# Patient Record
Sex: Female | Born: 1970 | Race: Black or African American | Hispanic: No | Smoking: Never smoker
Health system: Southern US, Community
[De-identification: ages and names within clinical notes are randomized; demographics above are authoritative.]

## PROBLEM LIST (undated history)

## (undated) DIAGNOSIS — E785 Hyperlipidemia, unspecified: Secondary | ICD-10-CM

## (undated) DIAGNOSIS — I10 Essential (primary) hypertension: Secondary | ICD-10-CM

## (undated) HISTORY — PX: ABDOMINAL HYSTERECTOMY: SHX81

---

## 2004-10-25 ENCOUNTER — Ambulatory Visit: Payer: Self-pay | Admitting: General Practice

## 2005-02-27 ENCOUNTER — Emergency Department: Payer: Self-pay | Admitting: Emergency Medicine

## 2005-05-30 ENCOUNTER — Emergency Department: Payer: Self-pay | Admitting: Emergency Medicine

## 2005-05-30 ENCOUNTER — Other Ambulatory Visit: Payer: Self-pay

## 2005-11-26 ENCOUNTER — Emergency Department: Payer: Self-pay | Admitting: Emergency Medicine

## 2006-04-30 ENCOUNTER — Emergency Department: Payer: Self-pay | Admitting: Emergency Medicine

## 2006-05-15 ENCOUNTER — Emergency Department: Payer: Self-pay | Admitting: Emergency Medicine

## 2006-11-20 ENCOUNTER — Emergency Department: Payer: Self-pay | Admitting: Emergency Medicine

## 2006-11-20 ENCOUNTER — Other Ambulatory Visit: Payer: Self-pay

## 2007-07-02 ENCOUNTER — Emergency Department: Payer: Self-pay | Admitting: Unknown Physician Specialty

## 2007-09-11 ENCOUNTER — Emergency Department: Payer: Self-pay | Admitting: Emergency Medicine

## 2008-06-11 ENCOUNTER — Emergency Department: Payer: Self-pay | Admitting: Emergency Medicine

## 2008-10-14 ENCOUNTER — Emergency Department: Payer: Self-pay | Admitting: Unknown Physician Specialty

## 2008-12-14 ENCOUNTER — Emergency Department: Payer: Self-pay | Admitting: Emergency Medicine

## 2009-02-06 ENCOUNTER — Ambulatory Visit: Payer: Self-pay

## 2009-02-12 ENCOUNTER — Ambulatory Visit: Payer: Self-pay

## 2009-08-14 ENCOUNTER — Ambulatory Visit: Payer: Self-pay

## 2009-08-20 ENCOUNTER — Inpatient Hospital Stay: Payer: Self-pay

## 2009-09-11 ENCOUNTER — Emergency Department: Payer: Self-pay | Admitting: Emergency Medicine

## 2010-10-04 ENCOUNTER — Emergency Department: Payer: Self-pay | Admitting: Emergency Medicine

## 2010-12-06 ENCOUNTER — Emergency Department: Payer: Self-pay | Admitting: Emergency Medicine

## 2011-05-11 ENCOUNTER — Emergency Department: Payer: Self-pay | Admitting: Internal Medicine

## 2011-08-10 ENCOUNTER — Emergency Department: Payer: Self-pay | Admitting: Emergency Medicine

## 2011-09-14 ENCOUNTER — Ambulatory Visit: Payer: Self-pay

## 2011-09-21 ENCOUNTER — Ambulatory Visit: Payer: Self-pay

## 2012-01-09 ENCOUNTER — Emergency Department: Payer: Self-pay | Admitting: Emergency Medicine

## 2012-01-09 LAB — COMPREHENSIVE METABOLIC PANEL
Albumin: 3.8 g/dL (ref 3.4–5.0)
Alkaline Phosphatase: 36 U/L — ABNORMAL LOW (ref 50–136)
BUN: 8 mg/dL (ref 7–18)
Bilirubin,Total: 0.3 mg/dL (ref 0.2–1.0)
Calcium, Total: 8.7 mg/dL (ref 8.5–10.1)
Chloride: 105 mmol/L (ref 98–107)
Co2: 25 mmol/L (ref 21–32)
Creatinine: 0.64 mg/dL (ref 0.60–1.30)
EGFR (Non-African Amer.): >60
Glucose: 99 mg/dL (ref 65–99)
SGOT(AST): 13 U/L — ABNORMAL LOW (ref 15–37)
SGPT (ALT): 15 U/L
Total Protein: 7.5 g/dL (ref 6.4–8.2)

## 2012-01-09 LAB — TROPONIN I
Troponin-I: 0.05 ng/mL
Troponin-I: 0.05 ng/mL

## 2012-01-09 LAB — CBC
HCT: 38.6 % (ref 35.0–47.0)
HGB: 13 g/dL (ref 12.0–16.0)
MCHC: 33.7 g/dL (ref 32.0–36.0)
MCV: 87 fL (ref 80–100)
RBC: 4.46 10*6/uL (ref 3.80–5.20)
WBC: 6.4 10*3/uL (ref 3.6–11.0)

## 2012-01-09 LAB — CK TOTAL AND CKMB (NOT AT ARMC)
CK, Total: 154 U/L (ref 21–215)
CK-MB: 0.9 ng/mL (ref 0.5–3.6)

## 2012-01-31 ENCOUNTER — Ambulatory Visit: Payer: Self-pay

## 2013-06-24 ENCOUNTER — Emergency Department: Payer: Self-pay | Admitting: Emergency Medicine

## 2013-07-24 ENCOUNTER — Ambulatory Visit: Payer: Self-pay

## 2013-09-08 ENCOUNTER — Emergency Department: Payer: Self-pay | Admitting: Emergency Medicine

## 2013-11-14 ENCOUNTER — Emergency Department: Payer: Self-pay | Admitting: Emergency Medicine

## 2013-11-14 LAB — CBC
HCT: 40.7 % (ref 35.0–47.0)
RBC: 4.83 10*6/uL (ref 3.80–5.20)
RDW: 13 % (ref 11.5–14.5)

## 2013-11-14 LAB — BASIC METABOLIC PANEL
BUN: 11 mg/dL (ref 7–18)
Chloride: 102 mmol/L (ref 98–107)
Osmolality: 269 (ref 275–301)
Potassium: 3.4 mmol/L — ABNORMAL LOW (ref 3.5–5.1)
Sodium: 135 mmol/L — ABNORMAL LOW (ref 136–145)

## 2013-12-17 ENCOUNTER — Emergency Department: Payer: Self-pay | Admitting: Emergency Medicine

## 2013-12-17 LAB — RAPID INFLUENZA A&B ANTIGENS

## 2013-12-27 ENCOUNTER — Emergency Department: Payer: Self-pay | Admitting: Emergency Medicine

## 2013-12-28 LAB — BASIC METABOLIC PANEL
Anion Gap: 6 — ABNORMAL LOW (ref 7–16)
BUN: 8 mg/dL (ref 7–18)
CALCIUM: 9.6 mg/dL (ref 8.5–10.1)
Chloride: 102 mmol/L (ref 98–107)
Co2: 26 mmol/L (ref 21–32)
Creatinine: 0.56 mg/dL — ABNORMAL LOW (ref 0.60–1.30)
GLUCOSE: 92 mg/dL (ref 65–99)
Osmolality: 266 (ref 275–301)
Potassium: 3.8 mmol/L (ref 3.5–5.1)
SODIUM: 134 mmol/L — AB (ref 136–145)

## 2013-12-28 LAB — CBC
HCT: 40.9 % (ref 35.0–47.0)
HGB: 13.3 g/dL (ref 12.0–16.0)
MCH: 27.8 pg (ref 26.0–34.0)
MCHC: 32.6 g/dL (ref 32.0–36.0)
MCV: 85 fL (ref 80–100)
PLATELETS: 334 10*3/uL (ref 150–440)
RBC: 4.8 10*6/uL (ref 3.80–5.20)
RDW: 13.3 % (ref 11.5–14.5)
WBC: 9.1 10*3/uL (ref 3.6–11.0)

## 2014-03-05 ENCOUNTER — Ambulatory Visit: Payer: Self-pay | Admitting: Family Medicine

## 2014-06-05 ENCOUNTER — Emergency Department: Payer: Self-pay | Admitting: Emergency Medicine

## 2014-06-05 LAB — COMPREHENSIVE METABOLIC PANEL
ALK PHOS: 44 U/L — AB
ALT: 22 U/L (ref 12–78)
AST: 27 U/L (ref 15–37)
Albumin: 4.1 g/dL (ref 3.4–5.0)
Anion Gap: 7 (ref 7–16)
BUN: 8 mg/dL (ref 7–18)
Bilirubin,Total: 0.5 mg/dL (ref 0.2–1.0)
CALCIUM: 9.4 mg/dL (ref 8.5–10.1)
CHLORIDE: 105 mmol/L (ref 98–107)
Co2: 24 mmol/L (ref 21–32)
Creatinine: 0.62 mg/dL (ref 0.60–1.30)
EGFR (African American): >60
EGFR (Non-African Amer.): >60
Glucose: 95 mg/dL (ref 65–99)
OSMOLALITY: 270 (ref 275–301)
POTASSIUM: 3.7 mmol/L (ref 3.5–5.1)
Sodium: 136 mmol/L (ref 136–145)
TOTAL PROTEIN: 8.1 g/dL (ref 6.4–8.2)

## 2014-06-05 LAB — CBC
HCT: 39.2 % (ref 35.0–47.0)
HGB: 13.4 g/dL (ref 12.0–16.0)
MCH: 28.9 pg (ref 26.0–34.0)
MCHC: 34.1 g/dL (ref 32.0–36.0)
MCV: 85 fL (ref 80–100)
Platelet: 297 10*3/uL (ref 150–440)
RBC: 4.63 10*6/uL (ref 3.80–5.20)
RDW: 13.3 % (ref 11.5–14.5)
WBC: 6.7 10*3/uL (ref 3.6–11.0)

## 2014-09-18 ENCOUNTER — Emergency Department: Payer: Self-pay | Admitting: Emergency Medicine

## 2014-09-18 LAB — CBC
HCT: 41.3 % (ref 35.0–47.0)
HGB: 13.2 g/dL (ref 12.0–16.0)
MCH: 27.7 pg (ref 26.0–34.0)
MCHC: 31.9 g/dL — AB (ref 32.0–36.0)
MCV: 87 fL (ref 80–100)
Platelet: 339 10*3/uL (ref 150–440)
RBC: 4.76 10*6/uL (ref 3.80–5.20)
RDW: 13.1 % (ref 11.5–14.5)
WBC: 7.9 10*3/uL (ref 3.6–11.0)

## 2014-09-18 LAB — BASIC METABOLIC PANEL
ANION GAP: 7 (ref 7–16)
BUN: 9 mg/dL (ref 7–18)
CHLORIDE: 103 mmol/L (ref 98–107)
Calcium, Total: 8.8 mg/dL (ref 8.5–10.1)
Co2: 29 mmol/L (ref 21–32)
Creatinine: 0.72 mg/dL (ref 0.60–1.30)
EGFR (Non-African Amer.): >60
GLUCOSE: 100 mg/dL — AB (ref 65–99)
OSMOLALITY: 276 (ref 275–301)
Potassium: 3.1 mmol/L — ABNORMAL LOW (ref 3.5–5.1)
Sodium: 139 mmol/L (ref 136–145)

## 2014-09-18 LAB — TROPONIN I
Troponin-I: <0.02 ng/mL
Troponin-I: <0.02 ng/mL

## 2015-09-30 ENCOUNTER — Ambulatory Visit: Payer: Self-pay

## 2015-10-05 ENCOUNTER — Encounter: Payer: Self-pay | Admitting: *Deleted

## 2015-10-05 ENCOUNTER — Ambulatory Visit
Admission: RE | Admit: 2015-10-05 | Discharge: 2015-10-05 | Disposition: A | Discharge status: Home / Self Care | Payer: Self-pay | Source: Ambulatory Visit | Attending: Oncology | Admitting: Oncology

## 2015-10-05 ENCOUNTER — Ambulatory Visit: Payer: Self-pay | Attending: Oncology | Admitting: *Deleted

## 2015-10-05 VITALS — BP 150/92 | HR 75 | Temp 98.0°F | Resp 18 | Ht 65.75 in | Wt 208.6 lb

## 2015-10-05 DIAGNOSIS — Z0000 Encounter for general adult medical examination without abnormal findings: Secondary | ICD-10-CM

## 2015-10-05 DIAGNOSIS — Z1231 Encounter for screening mammogram for malignant neoplasm of breast: Secondary | ICD-10-CM | POA: Insufficient documentation

## 2015-10-05 NOTE — Progress Notes (Signed)
Subjective:     Patient ID: Bethany Collins, female   DOB: 12/02/1971, 44 y.o.   MRN: [id number]  HPI   Review of Systems     Objective:   Physical Exam  Pulmonary/Chest: Right breast exhibits inverted nipple. Right breast exhibits no mass, no nipple discharge, no skin change and no tenderness. Left breast exhibits inverted nipple. Left breast exhibits no mass, no nipple discharge, no skin change and no tenderness. Breasts are symmetrical.  Bilateral nipples are inverted.  Patient states this is normal for her.       Assessment:        44 year [facility] Black [facility]  Clinical breast exam unremarkable.  Patient with bilateral inverted nipples that she states is normal for her.  Taught self breast awareness.   Blood pressure elevated at 150/92.  She is to take her meds as soon as possible and recheck her blood pressure at [facility] or [facility], and if remains higher than 140/90 she is to follow-up with her primary care provider.  Hand out on hypertention given to patient. Patient has been screened for eligibility.  She does not have any insurance, Medicare or Medicaid.  She also meets financial eligibility.  Hand-out given on the Affordable Care Act.  Plan:     Screening mammogram ordered.  Will follow up per protocol.

## 2015-10-05 NOTE — Patient Instructions (Signed)
Gave patient hand-out, Women Staying Healthy, Active and Well from [facility], with education on breast health, pap smears, heart and colon health. 

## 2015-10-06 NOTE — Progress Notes (Signed)
Letter mailed from [facility] to inform patient of her normal mammogram results.  Patient is to follow-up with annual screening in one year.  [facility] to [facility]y. 

## 2016-11-23 ENCOUNTER — Encounter: Payer: Self-pay | Admitting: Emergency Medicine

## 2016-11-23 ENCOUNTER — Emergency Department: Payer: Self-pay

## 2016-11-23 ENCOUNTER — Emergency Department
Admission: EM | Admit: 2016-11-23 | Discharge: 2016-11-23 | Disposition: A | Discharge status: Home / Self Care | Payer: Self-pay | Attending: Emergency Medicine | Admitting: Emergency Medicine

## 2016-11-23 DIAGNOSIS — R079 Chest pain, unspecified: Secondary | ICD-10-CM | POA: Insufficient documentation

## 2016-11-23 DIAGNOSIS — I10 Essential (primary) hypertension: Secondary | ICD-10-CM | POA: Insufficient documentation

## 2016-11-23 HISTORY — DX: Hyperlipidemia, unspecified: E78.5

## 2016-11-23 HISTORY — DX: Essential (primary) hypertension: I10

## 2016-11-23 LAB — CBC
HCT: 41.1 % (ref 35.0–47.0)
Hemoglobin: 13.6 g/dL (ref 12.0–16.0)
MCH: 28.3 pg (ref 26.0–34.0)
MCHC: 33.2 g/dL (ref 32.0–36.0)
MCV: 85.2 fL (ref 80.0–100.0)
PLATELETS: 418 10*3/uL (ref 150–440)
RBC: 4.82 MIL/uL (ref 3.80–5.20)
RDW: 13 % (ref 11.5–14.5)
WBC: 9.9 10*3/uL (ref 3.6–11.0)

## 2016-11-23 LAB — BASIC METABOLIC PANEL
Anion gap: 9 (ref 5–15)
BUN: 9 mg/dL (ref 6–20)
CHLORIDE: 101 mmol/L (ref 101–111)
CO2: 28 mmol/L (ref 22–32)
CREATININE: 0.67 mg/dL (ref 0.44–1.00)
Calcium: 9.6 mg/dL (ref 8.9–10.3)
Glucose, Bld: 91 mg/dL (ref 65–99)
POTASSIUM: 3.4 mmol/L — AB (ref 3.5–5.1)
SODIUM: 138 mmol/L (ref 135–145)

## 2016-11-23 LAB — TROPONIN I: Troponin I: <0.03 ng/mL (ref <0.03)

## 2016-11-23 NOTE — ED Triage Notes (Signed)
Pt comes into the ED via POV c/o chest pain on the left side of chest accompanied with tingling into the left arm.  Patient states it was a tightness that comes and go.  Patient in NAD at this time with even and unlabored respirations.  Patient denies and dizziness, N/V, lightheadedness.

## 2016-11-23 NOTE — ED Provider Notes (Signed)
[facility] Emergency Department Provider Note  Time seen: 2:24 PM  I have reviewed the triage vital signs and the nursing notes.   HISTORY  Chief Complaint Chest Pain    HPI Bethany Collins is a 45 y.o. female with a past medical history of hypertension, hyperlipidemia presents the emergency department for chest pain. According to the patient since early this morning she has been expressing intermittent chest pressure. States she was at work today when she felt a chest pressure, states she began breathing deeply which made the chest pressure go away but then began feeling tingling in her hands especially her left hand. States her symptoms have largely resolved at this time. Denies any direct history of cardiac disease but does state her mother's sister had a heart attack. Denies any nausea, shortness of breath or diaphoresis at any point. Describes the chest pressure is intermittent, mild and currently gone.  Past Medical History:  Diagnosis Date  . Hyperlipidemia   . Hypertension     There are no active problems to display for this patient.   Past Surgical History:  Procedure Laterality Date  . ABDOMINAL HYSTERECTOMY      Prior to Admission medications   Not on File    Allergies  Allergen Reactions  . Lisinopril Swelling    Family History  Problem Relation Age of Onset  . Breast cancer Cousin 45  . Breast cancer Cousin 4765    Social History Social History  Substance Use Topics  . Smoking status: Never Smoker  . Smokeless tobacco: Never Used  . Alcohol use No    Review of Systems Constitutional: Negative for fever. Cardiovascular: Intermittent chest pressure Respiratory: Negative for shortness of breath. Gastrointestinal: Negative for abdominal pain Musculoskeletal: Negative for back pain. Neurological: Negative for headache 10-point ROS otherwise negative.  ____________________________________________   PHYSICAL EXAM:  VITAL  SIGNS: ED Triage Vitals  Enc Vitals Group     BP 11/23/16 1127 (!) 145/76     Pulse Rate 11/23/16 1127 65     Resp 11/23/16 1127 17     Temp 11/23/16 1127 98.3 F (36.8 C)     Temp Source 11/23/16 1127 Oral     SpO2 11/23/16 1127 98 %     Weight 11/23/16 1127 200 lb (90.7 kg)     Height 11/23/16 1127 5\' 6"  (1.676 m)     Head Circumference --      Peak Flow --      Pain Score 11/23/16 1128 4     Pain Loc --      Pain Edu? --      Excl. in GC? --     Constitutional: Alert and oriented. Well appearing and in no distress. Eyes: Normal exam ENT   Head: Normocephalic and atraumatic.   Mouth/Throat: Mucous membranes are moist. Cardiovascular: Normal rate, regular rhythm. No murmur Respiratory: Normal respiratory effort without tachypnea nor retractions. Breath sounds are clear. Mild chest tenderness to palpation. Gastrointestinal: Soft and nontender. No distention.   Musculoskeletal: Nontender with normal range of motion in all extremities.  Neurologic:  Normal speech and language. No gross focal neurologic deficits  Skin:  Skin is warm, dry and intact.  Psychiatric: Mood and affect are normal.   ____________________________________________    EKG  EKG reviewed and interpreted by myself shows normal sinus rhythm at 63 bpm, narrow QRS, normal axis, normal intervals, no ST changes.  ____________________________________________    RADIOLOGY  Chest x-ray negative  ____________________________________________  INITIAL IMPRESSION / ASSESSMENT AND PLAN / ED COURSE  Pertinent labs & imaging results that were available during my care of the patient were reviewed by me and considered in my medical decision making (see chart for details).  The patient presents the emergency department for intermittent chest pressure beginning early this morning. Currently the patient is chest pain-free, does have mild chest tenderness to palpation. Patient's labs are within normal limits  including negative troponin, EKG is reassuring. Chest x-ray is negative. Given the patient's normal workup with resolution of chest discomfort I believe the patient is safe for discharge home with cardiology follow-up for a stress test in the near future. Patient is agreeable to plan and will call tomorrow to arrange this.  ____________________________________________   FINAL CLINICAL IMPRESSION(S) / ED DIAGNOSES  Chest pain    Minna AntisKevin Kunal Levario, MD 11/23/16 1426

## 2016-11-23 NOTE — Discharge Instructions (Signed)
You have been seen in the emergency department today for chest pain. Your workup has shown normal results. As we discussed please follow-up with your primary care physician in the next 1-2 days for recheck. Return to the emergency department for any further chest pain, trouble breathing, or any other symptom personally concerning to yourself. ° °Please call the number provided for cardiology to arrange a stress test as soon as possible. °

## 2016-12-14 ENCOUNTER — Ambulatory Visit
Admission: RE | Admit: 2016-12-14 | Discharge: 2016-12-14 | Disposition: A | Discharge status: Home / Self Care | Payer: Self-pay | Source: Ambulatory Visit | Attending: Oncology | Admitting: Oncology

## 2016-12-14 ENCOUNTER — Ambulatory Visit: Payer: Self-pay | Attending: Oncology

## 2016-12-14 VITALS — BP 134/81 | HR 80 | Temp 97.8°F | Ht 66.14 in | Wt 219.6 lb

## 2016-12-14 DIAGNOSIS — Z0000 Encounter for general adult medical examination without abnormal findings: Secondary | ICD-10-CM

## 2016-12-14 NOTE — Progress Notes (Signed)
Subjective:     Patient ID: Denita LungDeanna S Beaulac, female   DOB: 02/16/1971, 46 y.o.   MRN: [id number]  HPI   Review of Systems     Objective:   Physical Exam  Pulmonary/Chest: Right breast exhibits no inverted nipple, no mass, no nipple discharge, no skin change and no tenderness. Left breast exhibits no inverted nipple, no mass, no nipple discharge, no skin change and no tenderness. Breasts are symmetrical.       Assessment:     46 [facility].  Patient screened, and meets [facility] eligibility.  Patient does not have insurance, Medicare or Medicaid.  Handout given on Affordable Care Act. Instructed patient on breast self-exam using teach back method.  CBE unremarkable.  No mass or lump palpated.  Large pendulous breasts.    Plan:     Sent for bilateral screening mammogram.

## 2016-12-20 NOTE — Progress Notes (Signed)
Letter mailed from [facility] to notify of normal mammogram results.  Patient to return in one year for annual screening.  Copy to [facility]. 

## 2017-01-11 ENCOUNTER — Encounter: Payer: Self-pay | Admitting: Emergency Medicine

## 2017-01-11 ENCOUNTER — Emergency Department: Payer: Self-pay

## 2017-01-11 DIAGNOSIS — M79605 Pain in left leg: Secondary | ICD-10-CM | POA: Insufficient documentation

## 2017-01-11 DIAGNOSIS — I10 Essential (primary) hypertension: Secondary | ICD-10-CM | POA: Insufficient documentation

## 2017-01-11 NOTE — ED Triage Notes (Signed)
Pt ambulatory to triage in NAD, reports left lower leg pain x 1 day, states feels aching and tight, pt recently flew on Thursday.

## 2017-01-12 ENCOUNTER — Emergency Department
Admission: EM | Admit: 2017-01-12 | Discharge: 2017-01-12 | Disposition: A | Discharge status: Home / Self Care | Payer: Self-pay | Attending: Emergency Medicine | Admitting: Emergency Medicine

## 2017-01-12 DIAGNOSIS — M79605 Pain in left leg: Secondary | ICD-10-CM

## 2017-01-12 NOTE — Discharge Instructions (Signed)
Please seek medical attention for any high fevers, chest pain, shortness of breath, change in behavior, persistent vomiting, bloody stool or any other new or concerning symptoms.  

## 2017-01-12 NOTE — ED Provider Notes (Signed)
[facility] Emergency Department Provider Note   ____________________________________________   I have reviewed the triage vital signs and the nursing notes.   HISTORY  Chief Complaint Leg Pain   History limited by: Not Limited   HPI Bethany Collins is a 46 y.o. female who presents to the emergency department today because of concerns for left calf pain. The patient states pain started today. 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 He continued to get worse throughout the day and she noticed that it was a bit stiff after she had sat down for bingo. Patient describes the pain as being located behind her left knee. She states it is tightness. She denies any numbness or pain going down the rest of her leg. No trauma to her leg. She recently did return from travel.   Past Medical History:  Diagnosis Date  . Hyperlipidemia   . Hypertension     There are no active problems to display for this patient.   Past Surgical History:  Procedure Laterality Date  . ABDOMINAL HYSTERECTOMY      Prior to Admission medications   Not on File    Allergies Lisinopril  Family History  Problem Relation Age of Onset  . Breast cancer Cousin 45  . Breast cancer Cousin 74    Social History Social History  Substance Use Topics  . Smoking status: Never Smoker  . Smokeless tobacco: Never Used  . Alcohol use No    Review of Systems  Constitutional: Negative for fever. Cardiovascular: Negative for chest pain. Respiratory: Negative for shortness of breath. Gastrointestinal: Negative for abdominal pain, vomiting and diarrhea. Genitourinary: Negative for dysuria. Musculoskeletal: Positive for left leg pain. Neurological: Negative for headaches, focal weakness or numbness.  10-point ROS otherwise negative.  ____________________________________________   PHYSICAL EXAM:  VITAL SIGNS: ED Triage Vitals [01/11/17 2244]  Enc Vitals Group     BP  135/68     Pulse Rate 74     Resp 16     Temp 97.9 F (36.6 C)     Temp Source Oral     SpO2 98 %     Weight 170 lb (77.1 kg)     Height 5\' 5"  (1.651 m)     Head Circumference      Peak Flow      Pain Score 8    Constitutional: Alert and oriented. Well appearing and in no distress. Eyes: Conjunctivae are normal. Normal extraocular movements. ENT   Head: Normocephalic and atraumatic.   Nose: No congestion/rhinnorhea.   Mouth/Throat: Mucous membranes are moist.   Neck: No stridor. Hematological/Lymphatic/Immunilogical: No cervical lymphadenopathy. Cardiovascular: Normal rate, regular rhythm.  No murmurs, rubs, or gallops.  Respiratory: Normal respiratory effort without tachypnea nor retractions. Breath sounds are clear and equal bilaterally. No wheezes/rales/rhonchi. Gastrointestinal: Soft and non tender. No rebound. No guarding.  Genitourinary: Deferred Musculoskeletal: Normal range of motion in all extremities. No lower extremity edema. Mild tenderness to palpation of the left popliteal fossa without any swelling appreciated.  Neurologic:  Normal speech and language. No gross focal neurologic deficits are appreciated.  Skin:  Skin is warm, dry and intact. No rash noted. Psychiatric: Mood and affect are normal. Speech and behavior are normal. Patient exhibits appropriate insight and judgment.  ____________________________________________    LABS (pertinent positives/negatives)  None  ____________________________________________   EKG  None  ____________________________________________    RADIOLOGY  [place] left lower extremity  IMPRESSION: No evidence of deep venous thrombosis.  ____________________________________________   PROCEDURES  Procedures  ____________________________________________   INITIAL IMPRESSION / ASSESSMENT AND PLAN / ED COURSE  Pertinent labs & imaging results that were available during my care of the patient were reviewed  by me and considered in my medical decision making (see chart for details).  Patient presented to the emergency department today because of concerns for left leg pain. Ultrasound was negative. This point think a musculoskeletal pain could be related to squatting down. Did discuss care with patient.  ____________________________________________   FINAL CLINICAL IMPRESSION(S) / ED DIAGNOSES  Final diagnoses:  Left leg pain     Note: This dictation was prepared with Dragon dictation. Any transcriptional errors that result from this process are unintentional     Phineas SemenGraydon Haisley Arens, MD 01/12/17 [phone]

## 2017-02-24 ENCOUNTER — Emergency Department
Admission: EM | Admit: 2017-02-24 | Discharge: 2017-02-24 | Disposition: A | Discharge status: Home / Self Care | Payer: Self-pay | Attending: Emergency Medicine | Admitting: Emergency Medicine

## 2017-02-24 DIAGNOSIS — I10 Essential (primary) hypertension: Secondary | ICD-10-CM | POA: Insufficient documentation

## 2017-02-24 DIAGNOSIS — J029 Acute pharyngitis, unspecified: Secondary | ICD-10-CM | POA: Insufficient documentation

## 2017-02-24 LAB — POCT RAPID STREP A: STREPTOCOCCUS, GROUP A SCREEN (DIRECT): NEGATIVE

## 2017-02-24 MED ORDER — METHYLPREDNISOLONE 4 MG PO TBPK: ORAL_TABLET | ORAL | 0 refills | Status: DC

## 2017-02-24 MED ORDER — LIDOCAINE VISCOUS 2 % MT SOLN
15.0000 mL | Freq: Once | OROMUCOSAL | Status: AC
  Administered 2017-02-24: 15 mL via OROMUCOSAL
  Filled 2017-02-24: qty 15

## 2017-02-24 MED ORDER — LIDOCAINE VISCOUS 2 % MT SOLN: 5.0000 mL | Freq: Four times a day (QID) | OROMUCOSAL | 0 refills | Status: DC | PRN

## 2017-02-24 MED ORDER — FIRST-DUKES MOUTHWASH MT SUSP: 10.0000 mL | Freq: Four times a day (QID) | OROMUCOSAL | 0 refills | Status: DC

## 2017-02-24 MED ORDER — METHYLPREDNISOLONE SODIUM SUCC 125 MG IJ SOLR
125.0000 mg | Freq: Once | INTRAMUSCULAR | Status: AC
  Administered 2017-02-24: 125 mg via INTRAMUSCULAR
  Filled 2017-02-24: qty 2

## 2017-02-24 MED ORDER — DIPHENHYDRAMINE HCL 12.5 MG/5ML PO ELIX
25.0000 mg | ORAL_SOLUTION | Freq: Once | ORAL | Status: AC
  Administered 2017-02-24: 25 mg via ORAL
  Filled 2017-02-24: qty 10

## 2017-02-24 NOTE — Discharge Instructions (Signed)
Advised to not take amoxicillin unless there is a positive throat culture from this department. Continue codeine based cough medicine. Take prednisone, viscous lidocaine, and mouthwash as directed.

## 2017-02-24 NOTE — ED Provider Notes (Signed)
[facility] Emergency Department Provider Note   ____________________________________________   None    (approximate)  I have reviewed the triage vital signs and the nursing notes.   HISTORY  Chief Complaint Sore Throat    HPI Bethany Collins is a 46 y.o. female patient complaining of sore throat for 5 days. Patient seen by PCP today and prescribed amoxicillin and a cough medicine codeine. Patient states no improvement. Patient states pain feels all. No other palliative measures for her complaint.Patient's rate the pain as a 10 over 10. No other palliative measures for complaint.   Past Medical History:  Diagnosis Date  . Hyperlipidemia   . Hypertension     There are no active problems to display for this patient.   Past Surgical History:  Procedure Laterality Date  . ABDOMINAL HYSTERECTOMY      Prior to Admission medications   Medication Sig Start Date End Date Taking? Authorizing Provider  Diphenhyd-Hydrocort-Nystatin (FIRST-DUKES MOUTHWASH) SUSP Use as directed 10 mLs in the mouth or throat 4 (four) times daily. Mixed with viscous lidocaine for swish and swallow 02/24/17   Joni Reining, PA-C  lidocaine (XYLOCAINE) 2 % solution Use as directed 5 mLs in the mouth or throat every 6 (six) hours as needed for mouth pain. Mixed with [facility] mouthwash swish and swallow 02/24/17   Joni Reining, PA-C  methylPREDNISolone (MEDROL DOSEPAK) 4 MG TBPK tablet Take Tapered dose as directed 02/24/17   Joni Reining, PA-C    Allergies Lisinopril  Family History  Problem Relation Age of Onset  . Breast cancer Cousin 45  . Breast cancer Cousin 32    Social History Social History  Substance Use Topics  . Smoking status: Never Smoker  . Smokeless tobacco: Never Used  . Alcohol use No    Review of Systems Constitutional: No fever/chills Eyes: No visual changes. ENT: Sore throat : Cardiovascular: Denies chest pain. Respiratory: Denies shortness of  breath. Gastrointestinal: No abdominal pain.  No nausea, no vomiting.  No diarrhea.  No constipation. Genitourinary: Negative for dysuria. Musculoskeletal: Negative for back pain. Skin: Negative for rash. Neurological: Negative for headaches, focal weakness or numbness. Endocrine:Hyperlipidemia hypertension Allergic/Immunilogical: Lisinopril ____________________________________________   PHYSICAL EXAM:  VITAL SIGNS: ED Triage Vitals [02/24/17 2237]  Enc Vitals Group     BP (!) 124/99     Pulse Rate 89     Resp 18     Temp 98.8 F (37.1 C)     Temp Source Oral     SpO2 97 %     Weight 153 lb (69.4 kg)     Height 5\' 6"  (1.676 m)     Head Circumference      Peak Flow      Pain Score      Pain Loc      Pain Edu?      Excl. in GC?     Constitutional: Alert and oriented. Well appearing and in no acute distress. Eyes: Conjunctivae are normal. PERRL. EOMI. Head: Atraumatic. Nose: No congestion/rhinnorhea. Mouth/Throat: Mucous membranes are moist.  Oropharynx erythematous. Non-exudative edematous tonsils. Neck: No stridor.  No cervical spine tenderness to palpation. Hematological/Lymphatic/Immunilogical: No cervical lymphadenopathy. Cardiovascular: Normal rate, regular rhythm. Grossly normal heart sounds.  Good peripheral circulation. Respiratory: Normal respiratory effort.  No retractions. Lungs CTAB. Gastrointestinal: Soft and nontender. No distention. No abdominal bruits. No CVA tenderness. Musculoskeletal: No lower extremity tenderness nor edema.  No joint effusions. Neurologic:  Normal speech and language.  No gross focal neurologic deficits are appreciated. No gait instability. Skin:  Skin is warm, dry and intact. No rash noted. Psychiatric: Mood and affect are normal. Speech and behavior are normal.  ____________________________________________   LABS (all labs ordered are listed, but only abnormal results are displayed)  Labs Reviewed  CULTURE, GROUP A STREP [facility]   POCT RAPID STREP A   ____________________________________________  EKG   ____________________________________________  RADIOLOGY   ____________________________________________   PROCEDURES  Procedure(s) performed: None  Procedures  Critical Care performed: No  ____________________________________________   INITIAL IMPRESSION / ASSESSMENT AND PLAN / ED COURSE  Pertinent labs & imaging results that were available during my care of the patient were reviewed by me and considered in my medical decision making (see chart for details).  Pharyngitis. Discussed negative. Rapid strep test results with patient. Advised patient culture is pending. Advised patient  Not to take amoxicillin unless she is contacted by this department stating that her Strep culture was positive. Patient given Solu Medrol, viscous lidocaine, and Benadryl prior to departure. Patient given a prescription for [facility] mouthwash, viscous lidocaine, and Medrol Dosepak. Patient given discharge care instructions.      ____________________________________________   FINAL CLINICAL IMPRESSION(S) / ED DIAGNOSES  Final diagnoses:  Viral pharyngitis      NEW MEDICATIONS STARTED DURING THIS VISIT:  New Prescriptions   DIPHENHYD-HYDROCORT-NYSTATIN (FIRST-DUKES MOUTHWASH) SUSP    Use as directed 10 mLs in the mouth or throat 4 (four) times daily. Mixed with viscous lidocaine for swish and swallow   LIDOCAINE (XYLOCAINE) 2 % SOLUTION    Use as directed 5 mLs in the mouth or throat every 6 (six) hours as needed for mouth pain. Mixed with [facility] mouthwash swish and swallow   METHYLPREDNISOLONE (MEDROL DOSEPAK) 4 MG TBPK TABLET    Take Tapered dose as directed     Note:  This document was prepared using Dragon voice recognition software and may include unintentional dictation errors.    Joni Reiningonald K Smith, PA-C 02/24/17 2307    Joni Reiningonald K Smith, PA-C 02/24/17 2317    Merrily BrittleNeil Rifenbark, MD 02/24/17 [phone]

## 2017-02-24 NOTE — ED Triage Notes (Signed)
Pt reports to ED w/ sore throat x 4-5 days.  Pt sts that she was seen by PCP today, prescribed amoxicillin and "cough medication with codeine".  Pt sts that she does not feel better, throat sore, swollen. And painful to swallow.  Pt resp even and unlabored, A/OX4, NAD.

## 2017-02-24 NOTE — ED Notes (Signed)
Patient sore throat, facial swelling, throat tightness, and SOB X 1 week. Pt seen by primary care provider and prescribed amoxicillin.

## 2017-02-27 LAB — CULTURE, GROUP A STREP (THRC)

## 2018-03-21 ENCOUNTER — Ambulatory Visit
Admission: RE | Admit: 2018-03-21 | Discharge: 2018-03-21 | Disposition: A | Discharge status: Home / Self Care | Payer: Self-pay | Source: Ambulatory Visit | Attending: Oncology | Admitting: Oncology

## 2018-03-21 ENCOUNTER — Ambulatory Visit: Payer: Self-pay | Attending: Oncology

## 2018-03-21 VITALS — BP 139/83 | HR 101 | Temp 98.0°F | Resp 18 | Ht 66.0 in | Wt 223.0 lb

## 2018-03-21 DIAGNOSIS — Z0000 Encounter for general adult medical examination without abnormal findings: Secondary | ICD-10-CM

## 2018-03-21 NOTE — Progress Notes (Signed)
Patient sent for bilateral screening mammogram.  [facility] to [facility] results.  Patient to return in one year for annual screening.  Copy to [facility].

## 2018-03-21 NOTE — Progress Notes (Signed)
Subjective:     Patient ID: Denita LungDeanna S Dufrane, female   DOB: 06/14/1971, 47 y.o.   MRN: [id number]  HPI   Review of Systems     Objective:   Physical Exam  Pulmonary/Chest: Right breast exhibits no inverted nipple, no mass, no nipple discharge, no skin change and no tenderness. Left breast exhibits no inverted nipple, no mass, no nipple discharge, no skin change and no tenderness. Breasts are symmetrical.    Large pendulous breasts       Assessment:     47 year old patient returns for annual  [facility] screening.  Patient screened, and meets [facility] eligibility.  Patient does not have insurance, Medicare or Medicaid.  Handout given on Affordable Care Act.  Instructed patient on breast self awareness using teach back method.   CBE unremarkable.  No mass or lump palpated.      Plan:     Sent for bilateral screening mammogram.

## 2019-06-04 ENCOUNTER — Encounter: Payer: Self-pay | Admitting: *Deleted

## 2019-06-04 ENCOUNTER — Emergency Department: Payer: Self-pay

## 2019-06-04 ENCOUNTER — Emergency Department
Admission: EM | Admit: 2019-06-04 | Discharge: 2019-06-04 | Disposition: A | Discharge status: Home / Self Care | Payer: Self-pay | Attending: Student in an Organized Health Care Education/Training Program | Admitting: Student in an Organized Health Care Education/Training Program

## 2019-06-04 DIAGNOSIS — M79662 Pain in left lower leg: Secondary | ICD-10-CM | POA: Insufficient documentation

## 2019-06-04 DIAGNOSIS — I10 Essential (primary) hypertension: Secondary | ICD-10-CM | POA: Insufficient documentation

## 2019-06-04 MED ORDER — CYCLOBENZAPRINE HCL 5 MG PO TABS: 5.0000 mg | ORAL_TABLET | Freq: Three times a day (TID) | ORAL | 0 refills | Status: DC | PRN

## 2019-06-04 NOTE — ED Provider Notes (Signed)
[facility] Emergency Department Provider Note    First MD Initiated Contact with Patient 06/04/19 2033     (approximate)  I have reviewed the triage vital signs and the nursing notes.   HISTORY  Chief Complaint Leg Pain    HPI Bethany Collins is a 48 y.o. female low listed past medical history presents the ER for evaluation of calf pain and swelling started over the past 2 days became progressively worse today.  Is worse with ambulation.  She denies any trauma.  No history of DVT.  Denies any fevers.  No numbness or tingling.  States the pain is mild and achy.    Past Medical History:  Diagnosis Date   Hyperlipidemia    Hypertension    Family History  Problem Relation Age of Onset   Breast cancer Cousin 82   Breast cancer Cousin 43   Past Surgical History:  Procedure Laterality Date   ABDOMINAL HYSTERECTOMY     There are no active problems to display for this patient.     Prior to Admission medications   Medication Sig Start Date End Date Taking? Authorizing Provider  Diphenhyd-Hydrocort-Nystatin (FIRST-DUKES MOUTHWASH) SUSP Use as directed 10 mLs in the mouth or throat 4 (four) times daily. Mixed with viscous lidocaine for swish and swallow 02/24/17   Sable Feil, PA-C  lidocaine (XYLOCAINE) 2 % solution Use as directed 5 mLs in the mouth or throat every 6 (six) hours as needed for mouth pain. Mixed with [facility] mouthwash swish and swallow 02/24/17   Sable Feil, PA-C  methylPREDNISolone (MEDROL DOSEPAK) 4 MG TBPK tablet Take Tapered dose as directed 02/24/17   Sable Feil, PA-C    Allergies Lisinopril    Social History Social History   Tobacco Use   Smoking status: Never Smoker   Smokeless tobacco: Never Used  Substance Use Topics   Alcohol use: No    Alcohol/week: 0.0 standard drinks   Drug use: No    Review of Systems Patient denies headaches, rhinorrhea, blurry vision, numbness, shortness of breath, chest  pain, edema, cough, abdominal pain, nausea, vomiting, diarrhea, dysuria, fevers, rashes or hallucinations unless otherwise stated above in HPI. ____________________________________________   PHYSICAL EXAM:  VITAL SIGNS: Vitals:   06/04/19 2031 06/04/19 2032  BP: 126/85 126/82  Pulse: 75   Resp: 14   Temp: 98.2 F (36.8 C)   SpO2: 98% 98%    Constitutional: Alert and oriented.  Eyes: Conjunctivae are normal.  Head: Atraumatic. Nose: No congestion/rhinnorhea. Mouth/Throat: Mucous membranes are moist.   Neck: No stridor. Painless ROM.  Cardiovascular: Normal rate, regular rhythm. Grossly normal heart sounds.  Good peripheral circulation. Respiratory: Normal respiratory effort.  No retractions. Lungs CTAB. Gastrointestinal: Soft and nontender. No distention. No abdominal bruits. No CVA tenderness. Genitourinary:  Musculoskeletal: No lower extremity tenderness nor edema.  No joint effusions. Neurologic:  Normal speech and language. No gross focal neurologic deficits are appreciated. No facial droop Skin:  Skin is warm, dry and intact. No rash noted. Psychiatric: Mood and affect are normal. Speech and behavior are normal.  ____________________________________________   LABS (all labs ordered are listed, but only abnormal results are displayed)  No results found for this or any previous visit (from the past 24 hour(s)). ____________________________________________  EKG____________________________________________  RADIOLOGY  I personally reviewed all radiographic images ordered to evaluate for the above acute complaints and reviewed radiology reports and findings.  These findings were personally discussed with the patient.  Please  see medical record for radiology report.  ____________________________________________   PROCEDURES  Procedure(s) performed:  Procedures    Critical Care performed: no ____________________________________________   INITIAL IMPRESSION /  ASSESSMENT AND PLAN / ED COURSE  Pertinent labs & imaging results that were available during my care of the patient were reviewed by me and considered in my medical decision making (see chart for details).   DDX: DVT, cellulitis, edema, fracture, contusion, abscess  [name] is a 48 y.o. who presents to the ED with symptoms as described above.  Ultrasound ordered to exclude DVT shows none.  She does not have any bony tenderness to palpation.  No masses fluctuance or abnormality palpated.  She is able to ambulate with steady gait.  Suspect muscle spasm but I recommended outpatient follow-up.     The patient was evaluated in Emergency Department today for the symptoms described in the history of present illness. He/she was evaluated in the context of the global COVID-19 pandemic, which necessitated consideration that the patient might be at risk for infection with the SARS-CoV-2 virus that causes COVID-19. Institutional protocols and algorithms that pertain to the evaluation of patients at risk for COVID-19 are in a state of rapid change based on information released by regulatory bodies including the CDC and federal and state organizations. These policies and algorithms were followed during the patient's care in the ED.   As part of my medical decision making, I reviewed the following data within the electronic MEDICAL RECORD NUMBER Nursing notes reviewed and incorporated, Labs reviewed, notes from prior ED visits and [place] Controlled Substance Database   ____________________________________________   FINAL CLINICAL IMPRESSION(S) / ED DIAGNOSES  Final diagnoses:  Pain of left calf      NEW MEDICATIONS STARTED DURING THIS VISIT:  New Prescriptions   No medications on file     Note:  This document was prepared using Dragon voice recognition software and may include unintentional dictation errors.    Willy Eddyobinson, Bradford Cazier, MD 06/04/19 [phone]

## 2019-06-04 NOTE — ED Triage Notes (Signed)
Pt to Ed reporting 2 days of left sided calf pain that worsened today after walking around. Small amount of swelling noted to calf and tenderness to calf and behind left knee.

## 2019-06-04 NOTE — Discharge Instructions (Signed)
Please follow-up with PCP.  Return to the ER for any worsening symptoms including fevers numbness or tingling or inability to walk.

## 2020-05-12 ENCOUNTER — Other Ambulatory Visit: Payer: Self-pay

## 2020-05-12 ENCOUNTER — Ambulatory Visit: Payer: Self-pay | Attending: Oncology | Admitting: *Deleted

## 2020-05-12 ENCOUNTER — Ambulatory Visit
Admission: RE | Admit: 2020-05-12 | Discharge: 2020-05-12 | Disposition: A | Discharge status: Home / Self Care | Payer: Self-pay | Source: Ambulatory Visit | Attending: Oncology | Admitting: Oncology

## 2020-05-12 ENCOUNTER — Encounter: Payer: Self-pay | Admitting: *Deleted

## 2020-05-12 VITALS — BP 141/79 | HR 61 | Temp 98.8°F | Ht 63.0 in | Wt 229.4 lb

## 2020-05-12 DIAGNOSIS — Z0000 Encounter for general adult medical examination without abnormal findings: Secondary | ICD-10-CM

## 2020-05-12 NOTE — Progress Notes (Signed)
°  Subjective:     Patient ID: GRANT HENKES, female   DOB: 06/04/1971, 49 y.o.   MRN: [id number]  HPI   Review of Systems     Objective:   Physical Exam Chest:     Breasts:        Right: Inverted nipple present. No swelling, bleeding, mass, nipple discharge, skin change or tenderness.        Left: Inverted nipple present. No swelling, bleeding, mass, nipple discharge, skin change or tenderness.       Comments: Bilateral nipple are inverted - normal per patient Lymphadenopathy:     Upper Body:     Right upper body: No supraclavicular or axillary adenopathy.     Left upper body: No supraclavicular or axillary adenopathy.        Assessment:     49 year old Black female returns to [facility] for annual screening.  Clinical breast exam unremarkable.  Taught self breast awareness.  Last pap on 01/20/18 was negative with an endocervical component.  Patient has a history of hysterectomy.  Next pap due in 2022.  Patient has been screened for eligibility.  She does not have any insurance, Medicare or Medicaid.  She also meets financial eligibility.   Risk Assessment    Risk Scores      05/12/2020   Last edited by: Scarlett Presto, RN   5-year risk: 1.1 %   Lifetime risk: 8.8 %            Plan:     Screening mammogram ordered.  Will follow up per B[facility]CP protocol.

## 2020-05-12 NOTE — Patient Instructions (Signed)
Gave patient hand-out, Women Staying Healthy, Active and Well from [facility], with education on breast health, pap smears, heart and colon health. 

## 2020-05-14 ENCOUNTER — Encounter: Payer: Self-pay | Admitting: *Deleted

## 2020-05-14 NOTE — Progress Notes (Signed)
Letter mailed from the [facility] to inform patient of her normal mammogram results.  Patient is to follow-up with annual screening in one year. 

## 2020-12-22 NOTE — Progress Notes (Unsigned)
A televisit was used due to Covid 19 pandemic to review health history and current breast symptoms.  2 patient identifiers were used to confirm that I was speaking to the correct patient.  Patient was seen through our BCCCP program on 05/12/20 and had a birads 1 mammogram at that time.  She states she found a nodule at 6:00 left breast about "one inch" from the nipple.  States she has intermittent pain at the site of the nodule.  Rates pain an 8.  She has been using "Ibuprofen" for relief.  She also complains of right arm pain that radiates across her right chest/breast.  She has an appointment with her PCP on 01/13/21 for the right arm pain.  Discussed that we will have her come in for a uni left mammogram and ultrasound to assess the left breast nodule.  If her PCP feels her right arm pain is related to her breast, she is to call me back and we can reassess her right breast at that time.  She is to go directly to [facility] for her mammogram on 12/23/20 at 9:30.  Will follow up per BCCCP protocol.

## 2020-12-23 ENCOUNTER — Ambulatory Visit
Admission: RE | Admit: 2020-12-23 | Discharge: 2020-12-23 | Disposition: A | Discharge status: Home / Self Care | Payer: Self-pay | Source: Ambulatory Visit | Attending: Oncology | Admitting: Oncology

## 2020-12-23 ENCOUNTER — Encounter: Payer: Self-pay | Admitting: *Deleted

## 2020-12-23 ENCOUNTER — Other Ambulatory Visit: Payer: Self-pay

## 2020-12-23 ENCOUNTER — Ambulatory Visit: Payer: Self-pay | Attending: Oncology | Admitting: *Deleted

## 2020-12-23 DIAGNOSIS — N63 Unspecified lump in unspecified breast: Secondary | ICD-10-CM

## 2020-12-23 NOTE — Progress Notes (Signed)
Spoke to patient and reviewed normal mammogram and ultrasound results.  She is to call back if the area concern changes.  Otherwise she will be due for her next screening in 6 months.  She is agreeable.

## 2022-02-04 ENCOUNTER — Other Ambulatory Visit: Payer: Self-pay

## 2022-02-04 DIAGNOSIS — Z1231 Encounter for screening mammogram for malignant neoplasm of breast: Secondary | ICD-10-CM

## 2022-02-08 ENCOUNTER — Ambulatory Visit: Payer: Self-pay

## 2022-02-08 DIAGNOSIS — Z1211 Encounter for screening for malignant neoplasm of colon: Secondary | ICD-10-CM

## 2022-04-11 ENCOUNTER — Other Ambulatory Visit: Payer: Self-pay

## 2022-04-11 DIAGNOSIS — Z1211 Encounter for screening for malignant neoplasm of colon: Secondary | ICD-10-CM

## 2022-04-12 ENCOUNTER — Ambulatory Visit: Payer: Self-pay | Attending: Hematology and Oncology

## 2022-05-04 ENCOUNTER — Ambulatory Visit: Payer: Self-pay

## 2022-05-31 ENCOUNTER — Ambulatory Visit: Payer: Self-pay | Attending: Hematology and Oncology | Admitting: *Deleted

## 2022-05-31 ENCOUNTER — Ambulatory Visit
Admission: RE | Admit: 2022-05-31 | Discharge: 2022-05-31 | Disposition: A | Discharge status: Home / Self Care | Payer: Self-pay | Source: Ambulatory Visit | Attending: Obstetrics and Gynecology | Admitting: Obstetrics and Gynecology

## 2022-05-31 VITALS — BP 151/77 | Wt 230.8 lb

## 2022-05-31 DIAGNOSIS — Z1231 Encounter for screening mammogram for malignant neoplasm of breast: Secondary | ICD-10-CM

## 2022-05-31 DIAGNOSIS — Z01419 Encounter for gynecological examination (general) (routine) without abnormal findings: Secondary | ICD-10-CM

## 2022-05-31 NOTE — Progress Notes (Addendum)
Ms. Bethany Collins is a 51 y.o. female who presents to [facility] clinic today with no complaints. She is due for her annual well woman exam.  She presents for clinical breast exam and mammogram only.    Pap Smear: Pap not smear completed today. Last Pap smear was 01/23/18 at the [facility] clinic and was normal. Per patient has no history of an abnormal Pap smear. Last Pap smear result is available in Epic.  It was normal without HPV co-testing.  Offered pap today, but patient states she has an appointment with her PCP next month and she will have her pap completed at that time.   Physical exam: Breasts Breasts symmetrical. No skin abnormalities bilateral breasts. Bilateral nipples are inverted. Patient states normal for her.   No nipple discharge bilateral breasts. No lymphadenopathy. No lumps palpated bilateral breasts.       Pelvic/Bimanual Pap not completed today.   Smoking History: Patient has never smoked    Patient Navigation: Patient education provided. Access to services provided for patient through [facility] program. No interpreter provided. NO transportation provided   Colorectal Cancer Screening: Per patient has never had colonoscopy completed No complaints today. FIT test given today   Breast and Cervical Cancer Risk Assessment: Patient has family history of breast cancer in her maternal cousins, no known genetic mutations, or radiation treatment to the chest before age 52. Patient does not have history of cervical dysplasia, immunocompromised, or DES exposure in-utero.  Risk Assessment     Risk Scores       05/31/2022 05/12/2020   Last edited by: Narda Rutherford, LPN Scarlett Presto, RN   5-year risk: 1.2 % 1.1 %   Lifetime risk: 8.5 % 8.8 %            A: BCCCP exam without pap smear  P: Referred patient to the [facility] for a screening mammogram. Appointment scheduled for today.  Jim Like, RN 05/31/2022 9:50 AM

## 2022-10-27 IMAGING — MG MM DIGITAL DIAGNOSTIC UNILAT*L* W/ TOMO W/ CAD
6 of 10 series · 6 of 30 positions shown · non-contrast
Comparison: Previous exam(s).

CLINICAL DATA: Patient describes a palpable tender lump in the
lower inner quadrant of the LEFT breast.

EXAM:
DIGITAL DIAGNOSTIC LEFT MAMMOGRAM WITH CAD AND TOMOSYNTHESIS
ULTRASOUND BREAST LEFT
TECHNIQUE: Left digital diagnostic mammography, ultrasound and breast
tomosynthesis was performed. Digital images of the left breast were
evaluated with computer-aided detection. Targeted ultrasound
examination of the left breast was performed.

[L MLO synth-2D (1 of 3)]
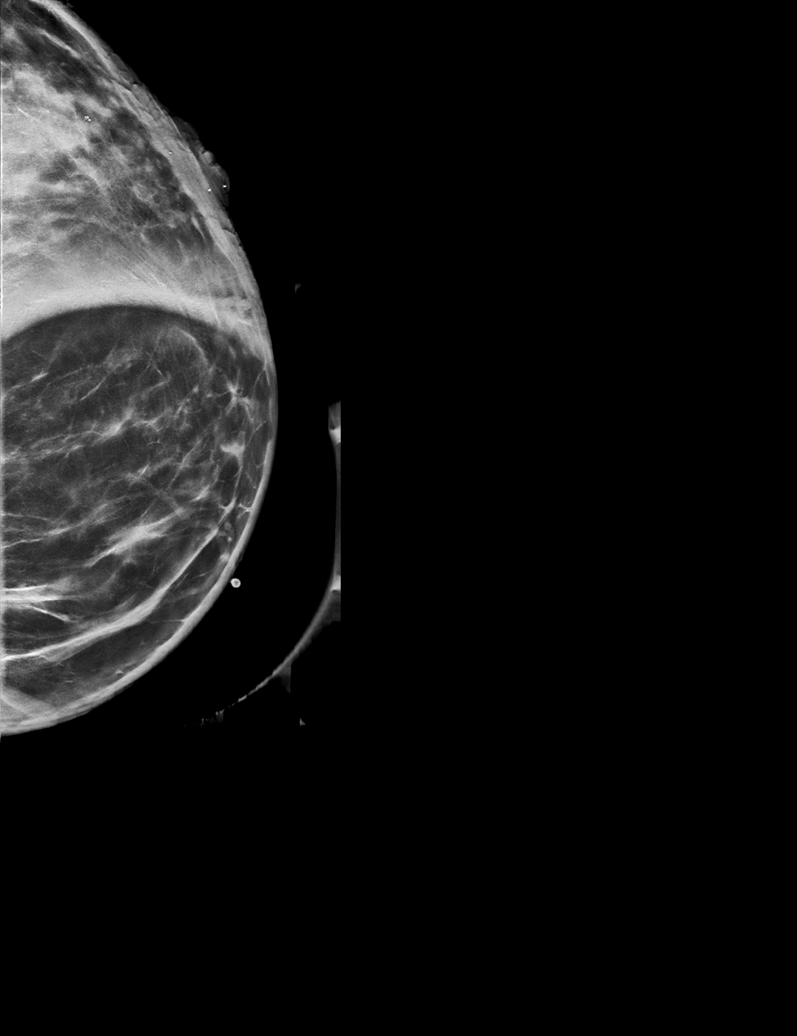

[L MLO synth-2D (2 of 3)]
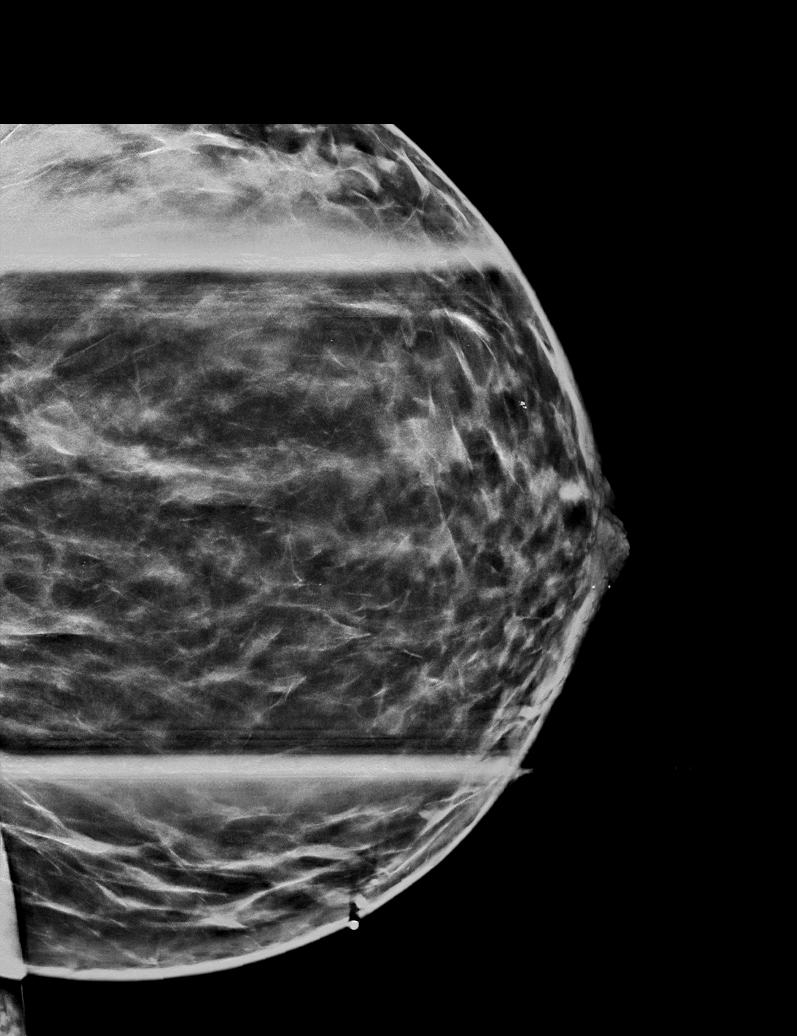

[L ML synth-2D]
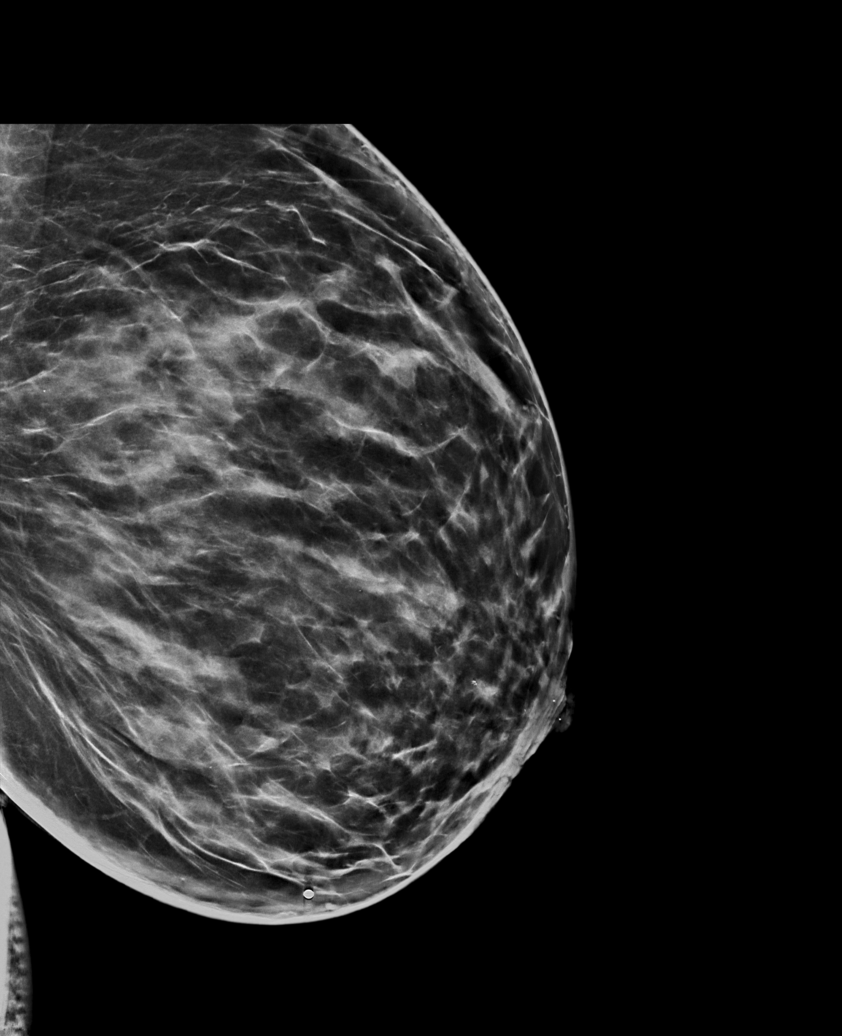

[L CC synth-2D]
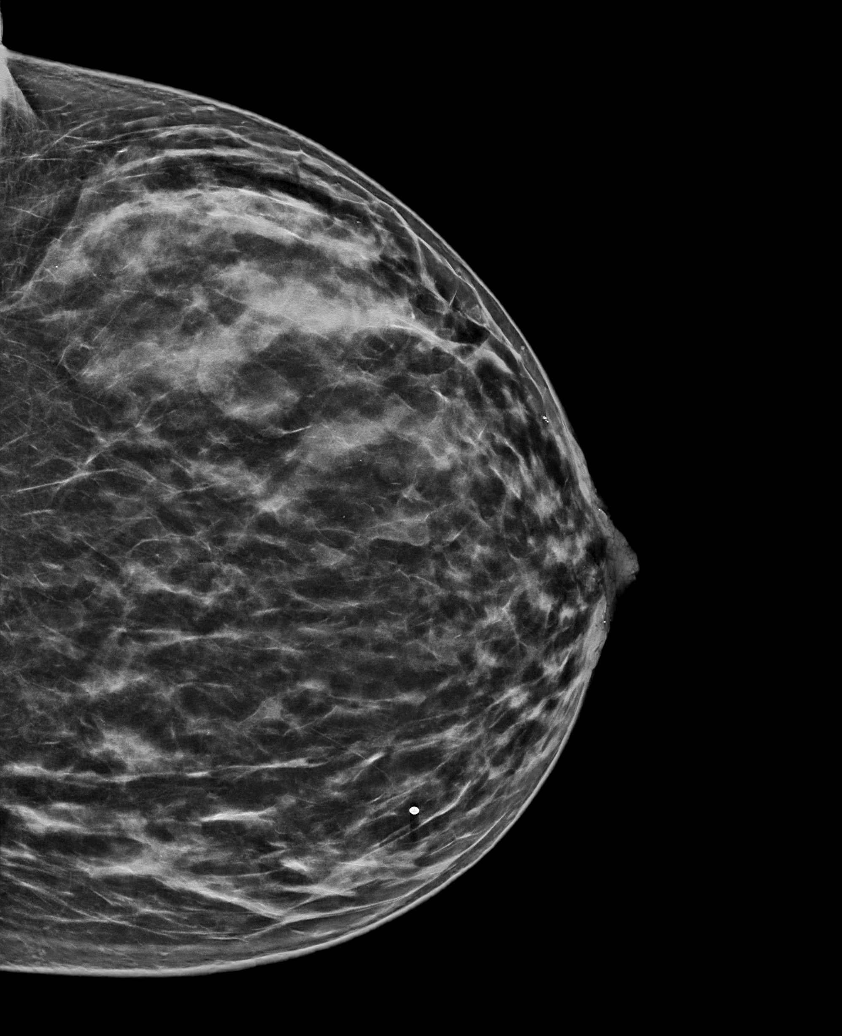

[L MLO synth-2D (3 of 3)]
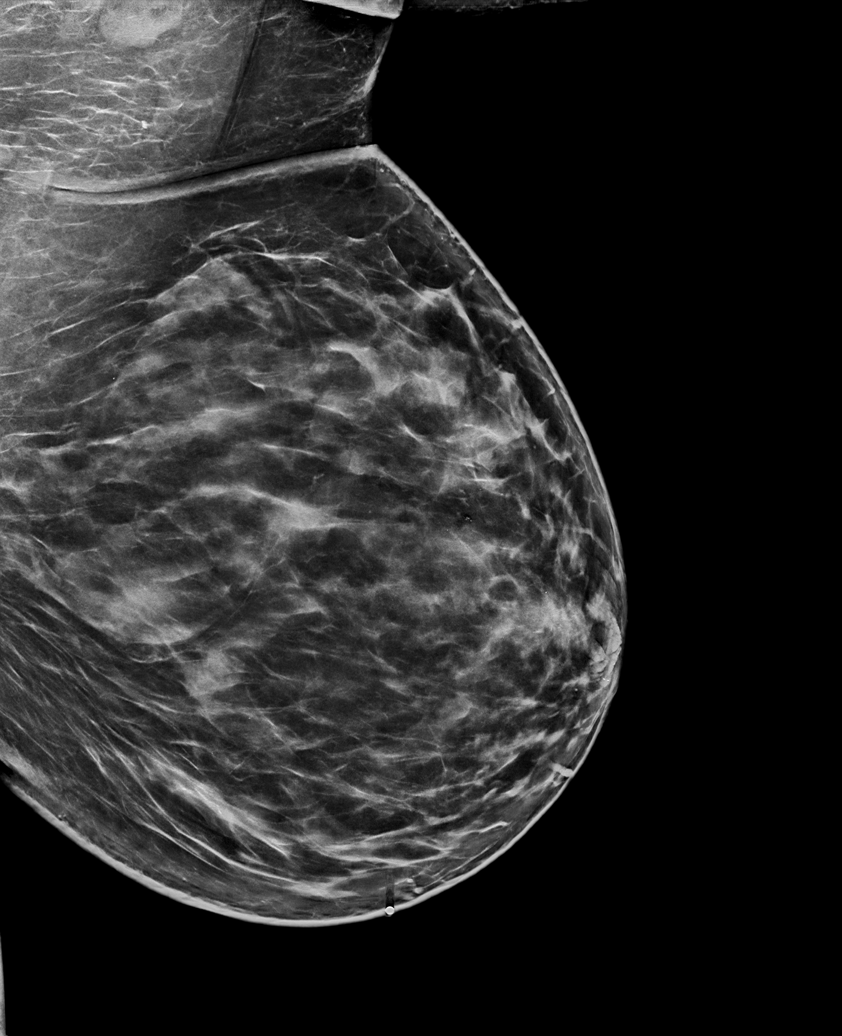

[L MLO tomo · tomo slice 40/79.0]
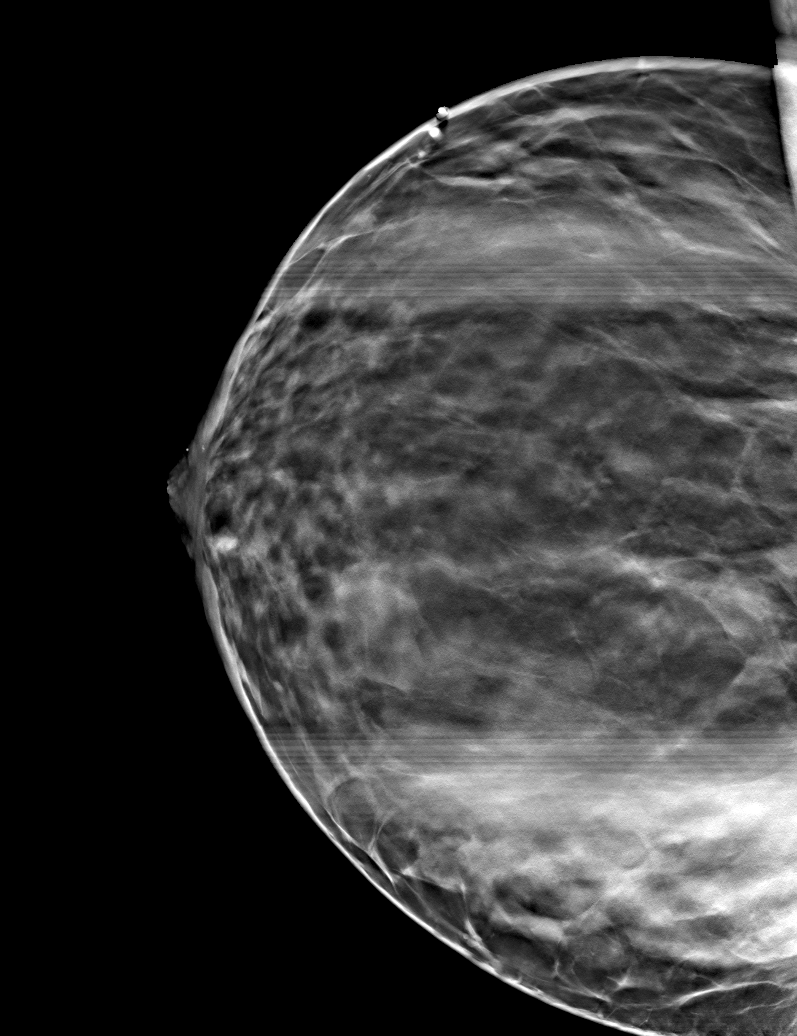

[6 of 30 positions shown; findings below may reference images not displayed]

ACR Breast Density Category c: The breast tissue is heterogeneously
dense, which may obscure small masses.
FINDINGS: There are no new dominant masses, suspicious calcifications or
secondary signs of malignancy within the LEFT breast. Specifically,
there is no mammographic abnormality within the lower inner quadrant
of the LEFT breast, corresponding to the area of clinical concern
with overlying skin marker in place.

On physical exam, there is vague thickening within the lower inner
quadrant without evidence of circumscribed mass.

Targeted ultrasound is performed, evaluating the entire lower LEFT
breast with particular attention to the lower inner quadrant and
periareolar regions of the LEFT breast as directed by the patient,
showing only normal fibroglandular tissues and fat lobules
throughout. No solid or cystic mass.
IMPRESSION: No evidence of malignancy within the LEFT breast.

RECOMMENDATION:
Annual screening mammograms. Next bilateral screening mammogram will
be due in Wednesday May, 2021.

I have discussed the findings and recommendations with the patient.
If applicable, a reminder letter will be sent to the patient
regarding the next appointment.

BI-RADS CATEGORY  1: Negative.

## 2023-01-05 HISTORY — PX: LIPOMA EXCISION: SHX5283

## 2023-05-17 ENCOUNTER — Other Ambulatory Visit: Payer: Self-pay | Admitting: Family Medicine

## 2023-05-17 DIAGNOSIS — Z1231 Encounter for screening mammogram for malignant neoplasm of breast: Secondary | ICD-10-CM

## 2023-05-25 ENCOUNTER — Telehealth: Payer: Self-pay | Admitting: *Deleted

## 2023-05-25 ENCOUNTER — Other Ambulatory Visit: Payer: Self-pay | Admitting: *Deleted

## 2023-05-25 DIAGNOSIS — Z1211 Encounter for screening for malignant neoplasm of colon: Secondary | ICD-10-CM

## 2023-05-25 MED ORDER — NA SULFATE-K SULFATE-MG SULF 17.5-3.13-1.6 GM/177ML PO SOLN: 1.0000 | Freq: Once | ORAL | 0 refills | Status: AC

## 2023-05-25 NOTE — Telephone Encounter (Addendum)
Gastroenterology Pre-Procedure Review  Request Date: 06/27/2023 Requesting Physician: Dr. Tobi Bastos  PATIENT REVIEW QUESTIONS: The patient responded to the following health history questions as indicated:    1. Are you having any GI issues? no 2. Do you have a personal history of Polyps? no 3. Do you have a family history of Colon Cancer or Polyps? yes( mom and dad had polyps) 4. Diabetes Mellitus? no 5. Joint replacements in the past 12 months?no 6. Major health problems in the past 3 months?no 7. Any artificial heart valves, MVP, or defibrillator?no    MEDICATIONS & ALLERGIES:    Patient reports the following regarding taking any anticoagulation/antiplatelet therapy:   Plavix, Coumadin, Eliquis, Xarelto, Lovenox, Pradaxa, Brilinta, or Effient? no Aspirin? yes(81 mg)  Patient confirms/reports the following medications:  Current Outpatient Medications  Medication Sig Dispense Refill   Na Sulfate-K Sulfate-Mg Sulf 17.5-3.13-1.6 GM/177ML SOLN Take 1 kit by mouth once for 1 dose. 354 mL 0   amLODipine (NORVASC) 10 MG tablet Take 10 mg by mouth daily.     aspirin EC 81 MG tablet Take 81 mg by mouth daily. Swallow whole.     atenolol (TENORMIN) 50 MG tablet Take 50 mg by mouth daily.     cyclobenzaprine (FLEXERIL) 10 MG tablet Take by mouth.     cyclobenzaprine (FLEXERIL) 5 MG tablet Take 1 tablet (5 mg total) by mouth 3 (three) times daily as needed for muscle spasms. 12 tablet 0   Diphenhyd-Hydrocort-Nystatin (FIRST-DUKES MOUTHWASH) SUSP Use as directed 10 mLs in the mouth or throat 4 (four) times daily. Mixed with viscous lidocaine for swish and swallow 237 mL 0   hydrochlorothiazide (HYDRODIURIL) 25 MG tablet Take 25 mg by mouth daily.     lidocaine (XYLOCAINE) 2 % solution Use as directed 5 mLs in the mouth or throat every 6 (six) hours as needed for mouth pain. Mixed with [facility] mouthwash swish and swallow 100 mL 0   methylPREDNISolone (MEDROL DOSEPAK) 4 MG TBPK tablet Take Tapered dose as  directed 21 tablet 0   No current facility-administered medications for this visit.    Patient confirms/reports the following allergies:  Allergies  Allergen Reactions   Lisinopril Swelling    No orders of the defined types were placed in this encounter.   AUTHORIZATION INFORMATION Primary Insurance: 1D#: Group #:  Secondary Insurance: 1D#: Group #:  SCHEDULE INFORMATION: Date: 06/28/2023 Time: Location:  [facility]

## 2023-06-20 ENCOUNTER — Encounter: Payer: Self-pay | Admitting: Gastroenterology

## 2023-06-26 ENCOUNTER — Encounter: Payer: Self-pay | Admitting: Gastroenterology

## 2023-06-27 ENCOUNTER — Ambulatory Visit: Payer: Medicaid Other | Admitting: Registered Nurse

## 2023-06-27 ENCOUNTER — Ambulatory Visit
Admission: RE | Admit: 2023-06-27 | Discharge: 2023-06-27 | Disposition: A | Discharge status: Home / Self Care | Payer: Medicaid Other | Attending: Gastroenterology | Admitting: Gastroenterology

## 2023-06-27 ENCOUNTER — Encounter
Admission: RE | Disposition: A | Discharge status: Home / Self Care | Payer: Self-pay | Source: Home / Self Care | Attending: Gastroenterology

## 2023-06-27 ENCOUNTER — Encounter: Payer: Self-pay | Admitting: Gastroenterology

## 2023-06-27 ENCOUNTER — Other Ambulatory Visit: Payer: Self-pay

## 2023-06-27 DIAGNOSIS — Z1211 Encounter for screening for malignant neoplasm of colon: Secondary | ICD-10-CM

## 2023-06-27 HISTORY — PX: COLONOSCOPY WITH PROPOFOL: SHX5780

## 2023-06-27 SURGERY — COLONOSCOPY WITH PROPOFOL
Anesthesia: General

## 2023-06-27 MED ORDER — SODIUM CHLORIDE 0.9 % IV SOLN: INTRAVENOUS | Status: DC

## 2023-06-27 MED ORDER — PROPOFOL 500 MG/50ML IV EMUL
INTRAVENOUS | Status: DC | PRN
  Administered 2023-06-27: 125 ug/kg/min via INTRAVENOUS

## 2023-06-27 MED ORDER — PROPOFOL 10 MG/ML IV BOLUS
INTRAVENOUS | Status: DC | PRN
Start: 2023-06-27 — End: 2023-06-27
  Administered 2023-06-27: 70 mg via INTRAVENOUS

## 2023-06-27 MED ORDER — LIDOCAINE HCL (CARDIAC) PF 100 MG/5ML IV SOSY
PREFILLED_SYRINGE | INTRAVENOUS | Status: DC | PRN
  Administered 2023-06-27: 50 mg via INTRAVENOUS

## 2023-06-27 MED ORDER — DEXMEDETOMIDINE HCL IN NACL 80 MCG/20ML IV SOLN
INTRAVENOUS | Status: DC | PRN
  Administered 2023-06-27: 8 ug via INTRAVENOUS

## 2023-06-27 NOTE — Anesthesia Procedure Notes (Signed)
Date/Time: 06/27/2023 9:15 AM  Performed by: Clarabelle Oscarson, [place], CRNAPre-anesthesia Checklist: Patient identified, Emergency Drugs available, Suction available, Patient being monitored and Timeout performed Patient Re-evaluated:Patient Re-evaluated prior to induction Oxygen Delivery Method: Nasal cannula Preoxygenation: Pre-oxygenation with 100% oxygen Induction Type: IV induction

## 2023-06-27 NOTE — Transfer of Care (Signed)
Immediate Anesthesia Transfer of Care Note  Patient: Bethany Collins  Procedure(s) Performed: COLONOSCOPY WITH PROPOFOL  Patient Location: Endoscopy Unit  Anesthesia Type:General  Level of Consciousness: awake and drowsy  Airway & Oxygen Therapy: Patient Spontanous Breathing  Post-op Assessment: Report given to RN and Post -op Vital signs reviewed and stable  Post vital signs: Reviewed and stable  Last Vitals:  Vitals Value Taken Time  BP 143/85 06/27/23 0935  Temp 36.4 C 06/27/23 0933  Pulse 88 06/27/23 0935  Resp 15 06/27/23 0935  SpO2 100 % 06/27/23 0935    Last Pain:  Vitals:   06/27/23 0933  TempSrc: Temporal  PainSc: 0-No pain         Complications: No notable events documented.

## 2023-06-27 NOTE — Op Note (Signed)
[facility] Gastroenterology Patient Name: Bethany Collins Procedure Date: 06/27/2023 8:32 AM MRN: [id number] Account #: [id number] Date of Birth: Feb 26, 1971 Admit Type: Outpatient Age: 52 Room: [facility] ENDO ROOM 2 Gender: Female Note Status: Finalized Instrument Name: Prentice Docker [id number] Procedure:             Colonoscopy Indications:           Screening for colorectal malignant neoplasm Providers:             Wyline Mood MD, MD Medicines:             Monitored Anesthesia Care Complications:         No immediate complications. Procedure:             Pre-Anesthesia Assessment:                        - Prior to the procedure, a History and Physical was                         performed, and patient medications, allergies and                         sensitivities were reviewed. The patient's tolerance                         of previous anesthesia was reviewed.                        - The risks and benefits of the procedure and the                         sedation options and risks were discussed with the                         patient. All questions were answered and informed                         consent was obtained.                        - ASA Grade Assessment: II - A patient with mild                         systemic disease.                        After obtaining informed consent, the colonoscope was                         passed under direct vision. Throughout the procedure,                         the patient's blood pressure, pulse, and oxygen                         saturations were monitored continuously. The                         Colonoscope was introduced through the anus and  advanced to the the cecum, identified by the                         appendiceal orifice. The Colonoscope was introduced                         through the and advanced to the the cecum, identified                         by the  appendiceal orifice. The colonoscopy was                         performed with ease. The patient tolerated the                         procedure well. The quality of the bowel preparation                         was excellent. The ileocecal valve, appendiceal                         orifice, and rectum were photographed. Findings:      The perianal and digital rectal examinations were normal.      The entire examined colon appeared normal on direct and retroflexion       views. Impression:            - The entire examined colon is normal on direct and                         retroflexion views.                        - No specimens collected. Recommendation:        - Discharge patient to home (with escort).                        - Resume previous diet.                        - Continue present medications.                        - Repeat colonoscopy in 10 years for screening                         purposes. Procedure Code(s):     --- Professional ---                        [phone], Colonoscopy, flexible; diagnostic, including                         collection of specimen(s) by brushing or washing, when                         performed (separate procedure) Diagnosis Code(s):     --- Professional ---                        Z12.11, Encounter for screening for malignant neoplasm  of colon CPT copyright 2022 American Medical Association. All rights reserved. The codes documented in this report are preliminary and upon coder review may  be revised to meet current compliance requirements. Wyline Mood, MD Wyline Mood MD, MD 06/27/2023 9:31:34 AM This report has been signed electronically. Number of Addenda: 0 Note Initiated On: 06/27/2023 8:32 AM Scope Withdrawal Time: 0 hours 9 minutes 34 seconds  Total Procedure Duration: 0 hours 12 minutes 44 seconds  Estimated Blood Loss:  Estimated blood loss: none.      [facility]

## 2023-06-27 NOTE — Anesthesia Preprocedure Evaluation (Signed)
Anesthesia Evaluation  Patient identified by MRN, date of birth, ID band Patient awake    Reviewed: Allergy & Precautions, H&P , NPO status , Patient's Chart, lab work & pertinent test results, reviewed documented beta blocker date and time   History of Anesthesia Complications Negative for: history of anesthetic complications  Airway Mallampati: IV  TM Distance: >3 FB Neck ROM: full    Dental  (+) Dental Advidsory Given, Edentulous Upper, Missing, Partial Lower   Pulmonary neg pulmonary ROS, Continuous Positive Airway Pressure Ventilation    Pulmonary exam normal breath sounds clear to auscultation       Cardiovascular Exercise Tolerance: Good hypertension, (-) angina (-) Past MI and (-) Cardiac Stents Normal cardiovascular exam(-) dysrhythmias (-) Valvular Problems/Murmurs Rhythm:regular Rate:Normal     Neuro/Psych negative neurological ROS  negative psych ROS   GI/Hepatic Neg liver ROS,GERD  ,,  Endo/Other  diabetes (borderline)  Morbid obesity  Renal/GU negative Renal ROS  negative genitourinary   Musculoskeletal   Abdominal   Peds  Hematology negative hematology ROS (+)   Anesthesia Other Findings Past Medical History: No date: Hyperlipidemia No date: Hypertension   Reproductive/Obstetrics negative OB ROS                             Anesthesia Physical Anesthesia Plan  ASA: 3  Anesthesia Plan: General   Post-op Pain Management:    Induction: Intravenous  PONV Risk Score and Plan: 3 and Propofol infusion and TIVA  Airway Management Planned: Natural Airway and Nasal Cannula  Additional Equipment:   Intra-op Plan:   Post-operative Plan:   Informed Consent: I have reviewed the patients History and Physical, chart, labs and discussed the procedure including the risks, benefits and alternatives for the proposed anesthesia with the patient or authorized representative who  has indicated his/her understanding and acceptance.     Dental Advisory Given  Plan Discussed with: Anesthesiologist, CRNA and Surgeon  Anesthesia Plan Comments:        Anesthesia Quick Evaluation

## 2023-06-27 NOTE — H&P (Signed)
Wyline Mood, MD [address], [address], [place], [place], [address] [address], [place], [place], [address] Phone: [phone]  Fax: [phone]  Primary Care Physician:  Leanna Sato, MD   Pre-Procedure History & Physical: HPI:  Bethany Collins is a 52 y.o. female is here for an colonoscopy.   Past Medical History:  Diagnosis Date   Hyperlipidemia    Hypertension     Past Surgical History:  Procedure Laterality Date   ABDOMINAL HYSTERECTOMY     LIPOMA EXCISION N/A 01/2023   Back    Prior to Admission medications   Medication Sig Start Date End Date Taking? Authorizing Provider  amLODipine (NORVASC) 10 MG tablet Take 10 mg by mouth daily. 02/25/22  Yes [provider]  atenolol (TENORMIN) 50 MG tablet Take 50 mg by mouth daily. 02/25/22  Yes [provider]  hydrochlorothiazide (HYDRODIURIL) 25 MG tablet Take 25 mg by mouth daily. 02/25/22  Yes [provider]  aspirin EC 81 MG tablet Take 81 mg by mouth daily. Swallow whole.    [provider]  cyclobenzaprine (FLEXERIL) 10 MG tablet Take by mouth. Patient not taking: Reported on 06/27/2023 01/26/22   [provider]    Allergies as of 05/25/2023 - Review Complete 05/31/2022  Allergen Reaction Noted   Lisinopril Swelling 10/05/2015    Family History  Problem Relation Age of Onset   Breast cancer Cousin 79   Breast cancer Cousin 67    Social History   Socioeconomic History   Marital status: Widowed    Spouse name: Not on file   Number of children: 2   Years of education: Not on file   Highest education level: High school graduate  Occupational History   Not on file  Tobacco Use   Smoking status: Never   Smokeless tobacco: Never  Vaping Use   Vaping status: Never Used  Substance and Sexual Activity   Alcohol use: No    Alcohol/week: 0.0 standard drinks of alcohol   Drug use: No   Sexual activity: Not Currently  Other Topics Concern   Not  on file  Social History Narrative   Not on file   Social Determinants of Health   Financial Resource Strain: Not on file  Food Insecurity: No Food Insecurity (05/31/2022)   Hunger Vital Sign    Worried About Running Out of Food in the Last Year: Never true    Ran Out of Food in the Last Year: Never true  Transportation Needs: No Transportation Needs (05/31/2022)   PRAPARE - Administrator, Civil Service (Medical): No    Lack of Transportation (Non-Medical): No  Physical Activity: Not on file  Stress: Not on file  Social Connections: Not on file  Intimate Partner Violence: Not on file    Review of Systems: See HPI, otherwise negative ROS  Physical Exam: BP (!) 157/99   Pulse 82   Temp (!) 97.2 F (36.2 C) (Temporal)   Resp 20   Ht 5\' 3"  (1.6 m)   Wt 99.1 kg   LMP 10/04/2009 Comment: Had a partial hysterectomy 2010  SpO2 100%   BMI 38.69 kg/m  General:   Alert,  pleasant and cooperative in NAD Head:  Normocephalic and atraumatic. Neck:  Supple; no masses or thyromegaly. Lungs:  Clear throughout to auscultation, normal respiratory effort.    Heart:  +S1, +S2, Regular rate and rhythm, No edema. Abdomen:  Soft, nontender and nondistended. Normal  bowel sounds, without guarding, and without rebound.   Neurologic:  Alert and  oriented x4;  grossly normal neurologically.  Impression/Plan: DYASIA FIRESTINE is here for an colonoscopy to be performed for Screening colonoscopy average risk   Risks, benefits, limitations, and alternatives regarding  colonoscopy have been reviewed with the patient.  Questions have been answered.  All parties agreeable.   Wyline Mood, MD  06/27/2023, 8:41 AM

## 2023-06-28 ENCOUNTER — Encounter: Payer: Self-pay | Admitting: Gastroenterology

## 2023-06-28 NOTE — Anesthesia Postprocedure Evaluation (Signed)
Anesthesia Post Note  Patient: Bethany Collins  Procedure(s) Performed: COLONOSCOPY WITH PROPOFOL  Anesthesia Type: General Anesthetic complications: no   No notable events documented.   Last Vitals:  Vitals:   06/27/23 0935 06/27/23 0953  BP: (!) 143/85 (!) 136/95  Pulse: 88   Resp: 15   Temp:    SpO2: 100%     Last Pain:  Vitals:   06/28/23 0738  TempSrc:   PainSc: 0-No pain                 Lenard Simmer

## 2023-07-05 ENCOUNTER — Ambulatory Visit
Admission: RE | Admit: 2023-07-05 | Discharge: 2023-07-05 | Disposition: A | Discharge status: Home / Self Care | Payer: Medicaid Other | Source: Ambulatory Visit | Attending: Family Medicine | Admitting: Family Medicine

## 2023-07-05 DIAGNOSIS — Z1231 Encounter for screening mammogram for malignant neoplasm of breast: Secondary | ICD-10-CM | POA: Diagnosis present

## 2023-07-14 ENCOUNTER — Other Ambulatory Visit: Payer: Self-pay | Admitting: Family Medicine

## 2023-07-14 DIAGNOSIS — N6489 Other specified disorders of breast: Secondary | ICD-10-CM

## 2023-07-14 DIAGNOSIS — R928 Other abnormal and inconclusive findings on diagnostic imaging of breast: Secondary | ICD-10-CM

## 2023-07-19 ENCOUNTER — Ambulatory Visit
Admission: RE | Admit: 2023-07-19 | Discharge: 2023-07-19 | Disposition: A | Discharge status: Home / Self Care | Payer: Medicaid Other | Source: Ambulatory Visit | Attending: Family Medicine | Admitting: Family Medicine

## 2023-07-19 DIAGNOSIS — R928 Other abnormal and inconclusive findings on diagnostic imaging of breast: Secondary | ICD-10-CM | POA: Diagnosis not present

## 2023-07-19 DIAGNOSIS — N6489 Other specified disorders of breast: Secondary | ICD-10-CM | POA: Insufficient documentation

## 2024-07-11 ENCOUNTER — Other Ambulatory Visit (HOSPITAL_COMMUNITY)
Admission: RE | Admit: 2024-07-11 | Discharge: 2024-07-11 | Disposition: A | Discharge status: Home / Self Care | Source: Ambulatory Visit | Attending: Advanced Practice Midwife | Admitting: Advanced Practice Midwife

## 2024-07-11 ENCOUNTER — Encounter: Payer: Self-pay | Admitting: Advanced Practice Midwife

## 2024-07-11 ENCOUNTER — Ambulatory Visit (INDEPENDENT_AMBULATORY_CARE_PROVIDER_SITE_OTHER): Admitting: Advanced Practice Midwife

## 2024-07-11 VITALS — Ht 66.0 in | Wt 226.5 lb

## 2024-07-11 DIAGNOSIS — Z124 Encounter for screening for malignant neoplasm of cervix: Secondary | ICD-10-CM | POA: Diagnosis present

## 2024-07-11 NOTE — Progress Notes (Signed)
 Patient ID: Bethany Collins, female   DOB: 04-06-1971, 53 y.o.   MRN: [id number]  Reason for Consult: referred for PAP   Referred by [place] HERO, MD  Subjective:  HPI  Bethany Collins is a 53 y.o. female being seen on referral for repeat PAP. Was seen at [facility] in June for PAP with normal result, however, no endocervical component present due to stenotic cervix. Reviewed this result with patient and offered to repeat PAP and attempt to obtain endocervical cells. Patient is agreeable to plan. Explained after PAP, same finding as previous PAP. Attempted with some pressure to insert brush into cervix and it would not pass. Cervix is tightly closed.   Patient is not currently sexually active and has low risk for STDs. History of cesarean deliveries. Hysterectomy (partial) in 2008- cervix still present. Recommend follow current guidelines for next PAP in 3-5 years following normal result, or sooner if risk for STDs changes.   Past Medical History:  Diagnosis Date   Hyperlipidemia    Hypertension    Family History  Problem Relation Age of Onset   Hypertension Mother    Hypertension Father    Diabetes Sister    Diabetes Sister    Breast cancer Cousin 75   Breast cancer Cousin 50   Past Surgical History:  Procedure Laterality Date   ABDOMINAL HYSTERECTOMY     COLONOSCOPY WITH PROPOFOL  N/A 06/27/2023   Procedure: COLONOSCOPY WITH PROPOFOL ;  Surgeon: Therisa Bi, MD;  Location: [facility] ENDOSCOPY;  Service: Gastroenterology;  Laterality: N/A;   LIPOMA EXCISION N/A 01/2023   Back    Short Social History:  Social History   Tobacco Use   Smoking status: Never   Smokeless tobacco: Never  Substance Use Topics   Alcohol use: No    Alcohol/week: 0.0 standard drinks of alcohol    Allergies  Allergen Reactions   Lisinopril Swelling    Current Outpatient Medications  Medication Sig Dispense Refill   amLODipine (NORVASC) 10 MG tablet Take 10 mg by mouth daily.     aspirin EC 81  MG tablet Take 81 mg by mouth daily. Swallow whole.     atenolol (TENORMIN) 50 MG tablet Take 50 mg by mouth daily.     hydrochlorothiazide (HYDRODIURIL) 25 MG tablet Take 25 mg by mouth daily.     cyclobenzaprine  (FLEXERIL ) 10 MG tablet Take by mouth. (Patient not taking: Reported on 07/11/2024)     spironolactone (ALDACTONE) 25 MG tablet Take 25 mg by mouth daily.     No current facility-administered medications for this visit.    Review of Systems  Constitutional:  Negative for chills and fever.  HENT:  Negative for congestion, ear discharge, ear pain, hearing loss, sinus pain and sore throat.   Eyes:  Negative for blurred vision and double vision.  Respiratory:  Negative for cough, shortness of breath and wheezing.   Cardiovascular:  Negative for chest pain, palpitations and leg swelling.  Gastrointestinal:  Negative for abdominal pain, blood in stool, constipation, diarrhea, heartburn, melena, nausea and vomiting.  Genitourinary:  Negative for dysuria, flank pain, frequency, hematuria and urgency.  Musculoskeletal:  Negative for back pain, joint pain and myalgias.  Skin:  Negative for itching and rash.  Neurological:  Negative for dizziness, tingling, tremors, sensory change, speech change, focal weakness, seizures, loss of consciousness, weakness and headaches.  Endo/Heme/Allergies:  Negative for environmental allergies. Does not bruise/bleed easily.  Psychiatric/Behavioral:  Negative for depression, hallucinations, memory loss, substance abuse and  suicidal ideas. The patient is not nervous/anxious and does not have insomnia.         Objective:  Objective    Vital Signs: Ht 5' 6 (1.676 m)   Wt 226 lb 8 oz (102.7 kg)   LMP 10/04/2009 Comment: Had a partial hysterectomy 2010  BMI 36.56 kg/m  Constitutional: obese female in no acute distress.  HEENT: normal Skin: Warm and dry.  Cardiovascular: Regular rate and rhythm.   Respiratory:  Normal respiratory effort Psych: Alert  and Oriented x3. No memory deficits. Normal mood and affect.    Pelvic exam:  is not limited by body habitus EGBUS: within normal limits Vagina: within normal limits and with normal mucosa  Cervix: normal appearance, os is visually stenotic, brush will not penetrate   Assessment/Plan:     53 y.o. G2 P2002 female repeat PAP on referral due to stenotic cervix  PAP collected (with spatula) Follow up after lab results   Slater Rains, CNM [facility] 07/11/2024 5:20 PM

## 2024-07-11 NOTE — Patient Instructions (Signed)
 Pap Test Why am I having this test? A Pap test, also called a Pap smear, is a screening test to check for signs of: Infection. Cancer of the cervix. The cervix is the lower part of the uterus that opens into the vagina. Changes that may be a sign that cancer is developing (precancerous changes). Women need this test on a regular basis. In general, you should have a Pap test every 3 years until you reach menopause or age 54. Women aged 30-60 may choose to have their Pap test done at the same time as an HPV (human papillomavirus) test every 5 years (instead of every 3 years). Your health care provider may recommend having Pap tests more or less often depending on your medical conditions and past Pap test results. What is being tested? Cervical cells are tested for signs of infection or abnormalities. What kind of sample is taken?  Your health care provider will collect a sample of cells from the surface of your cervix. This will be done using a small cotton swab, plastic spatula, or brush that is inserted into your vagina using a tool called a speculum. This sample is often collected during a pelvic exam, when you are lying on your back on an exam table with your feet in footrests (stirrups). In some cases, fluids (secretions) from the cervix or vagina may also be collected. How do I prepare for this test? Be aware of where you are in your menstrual cycle. If you are menstruating on the day of the test, you may be asked to reschedule. You may need to reschedule if you have a known vaginal infection on the day of the test. Follow instructions from your health care provider about: Changing or stopping your regular medicines. Some medicines can cause abnormal test results, such as vaginal medicines and tetracycline. Avoiding douching 2-3 days before or the day of the test. Tell a health care provider about: Any allergies you have. All medicines you are taking, including vitamins, herbs, eye drops,  creams, and over-the-counter medicines. Any bleeding problems you have. Any surgeries you have had. Any medical conditions you have. Whether you are pregnant or may be pregnant. How are the results reported? Your test results will be reported as either abnormal or normal. What do the results mean? A normal test result means that you do not have signs of cancer of the cervix. An abnormal result may mean that you have: Cancer. A Pap test by itself is not enough to diagnose cancer. You will have more tests done if cancer is suspected. Precancerous changes in your cervix. Inflammation of the cervix. An STI (sexually transmitted infection). A fungal infection. A parasite infection. Talk with your health care provider about what your results mean. In some cases, your health care provider may do more testing to confirm the results. Questions to ask your health care provider Ask your health care provider, or the department that is doing the test: When will my results be ready? How will I get my results? What are my treatment options? What other tests do I need? What are my next steps? Summary In general, women should have a Pap test every 3 years until they reach menopause or age 21. Your health care provider will collect a sample of cells from the surface of your cervix. This will be done using a small cotton swab, plastic spatula, or brush. In some cases, fluids (secretions) from the cervix or vagina may also be collected. This information is not  intended to replace advice given to you by your health care provider. Make sure you discuss any questions you have with your health care provider. Document Revised: 02/19/2021 Document Reviewed: 02/19/2021 Elsevier Patient Education  2025 [employer].

## 2024-07-17 ENCOUNTER — Ambulatory Visit: Payer: Self-pay | Admitting: Advanced Practice Midwife

## 2024-07-17 LAB — CYTOLOGY - PAP
Adequacy: ABSENT
Comment: NEGATIVE
Diagnosis: NEGATIVE
High risk HPV: NEGATIVE

## 2024-09-04 ENCOUNTER — Other Ambulatory Visit: Payer: Self-pay | Admitting: Family Medicine

## 2024-09-04 DIAGNOSIS — Z1231 Encounter for screening mammogram for malignant neoplasm of breast: Secondary | ICD-10-CM

## 2024-09-26 ENCOUNTER — Ambulatory Visit
Admission: RE | Admit: 2024-09-26 | Discharge: 2024-09-26 | Disposition: A | Discharge status: Home / Self Care | Source: Ambulatory Visit | Attending: Family Medicine | Admitting: Family Medicine

## 2024-09-26 DIAGNOSIS — Z1231 Encounter for screening mammogram for malignant neoplasm of breast: Secondary | ICD-10-CM | POA: Diagnosis present
